# Patient Record
Sex: Male | Born: 1955 | Race: White | Hispanic: No | Marital: Single
Health system: Southern US, Community
[De-identification: ages and names within clinical notes are randomized; demographics above are authoritative.]

---

## 2006-02-05 ENCOUNTER — Ambulatory Visit: Payer: Self-pay | Admitting: Gastroenterology

## 2006-05-27 ENCOUNTER — Ambulatory Visit: Payer: Self-pay | Admitting: Internal Medicine

## 2008-06-15 IMAGING — US ABDOMEN ULTRASOUND
1 series · 17 of 25 positions shown · non-contrast
Comparison: none

REASON FOR EXAM: Abdominal pain, diarrhea
COMMENTS:

[Series 1: abdomen ultrasound · 17 of 70 slices shown]
[im 1/70]
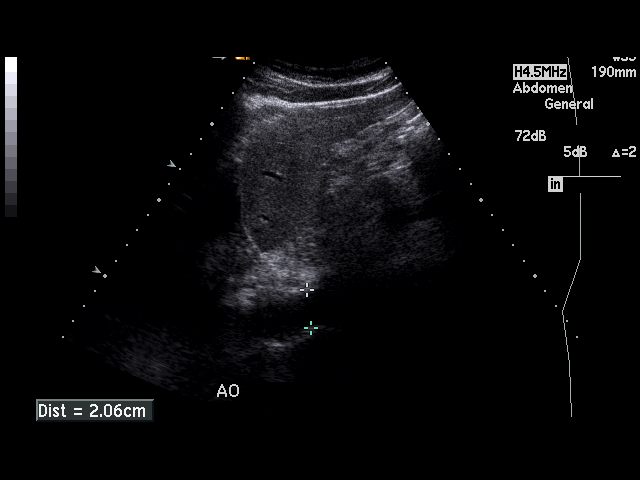
[im 6/70]
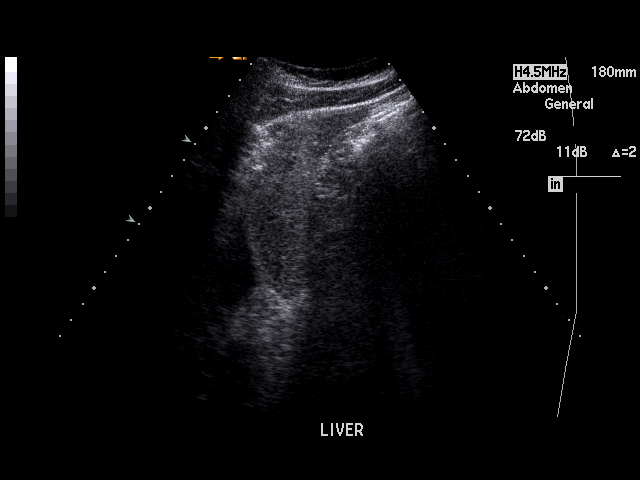
[im 9/70]
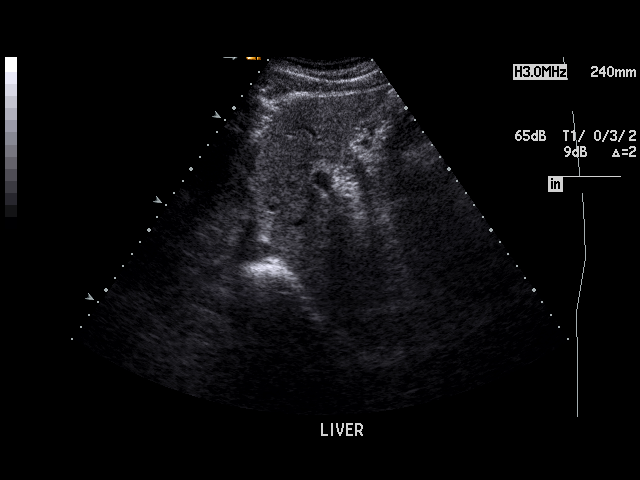
[im 15/70]
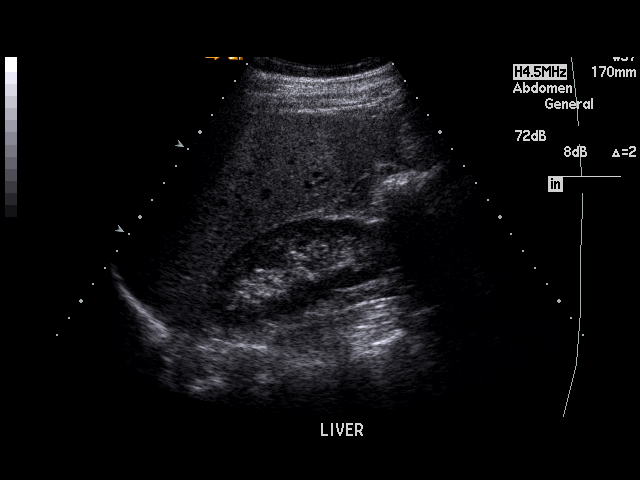
[im 18/70]
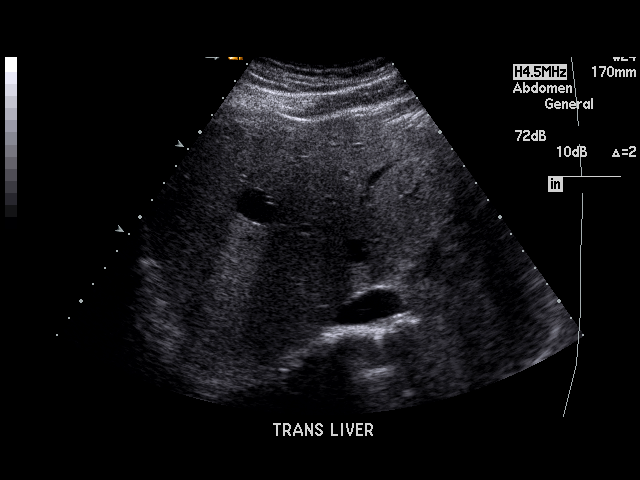
[im 24/70]
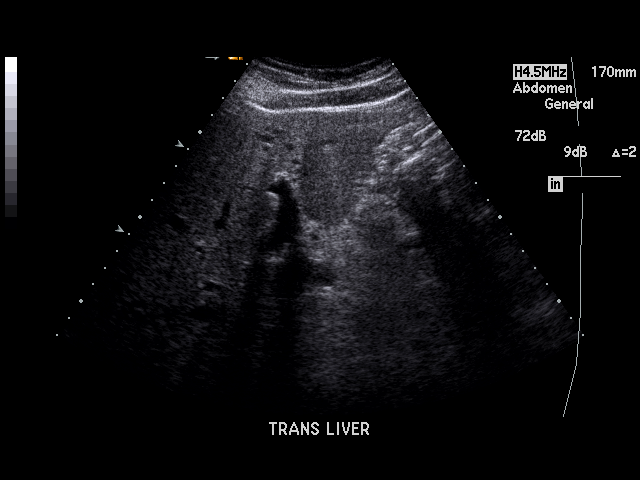
[im 26/70]
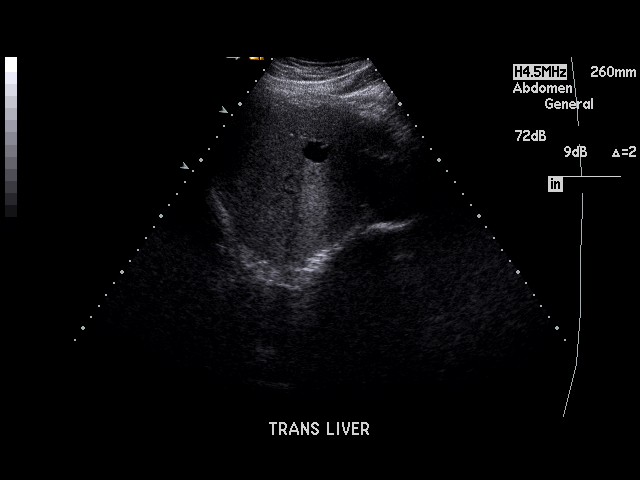
[im 32/70]
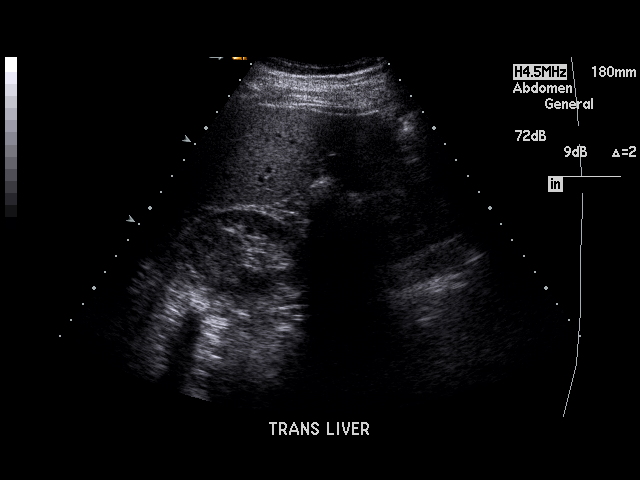
[im 35/70]
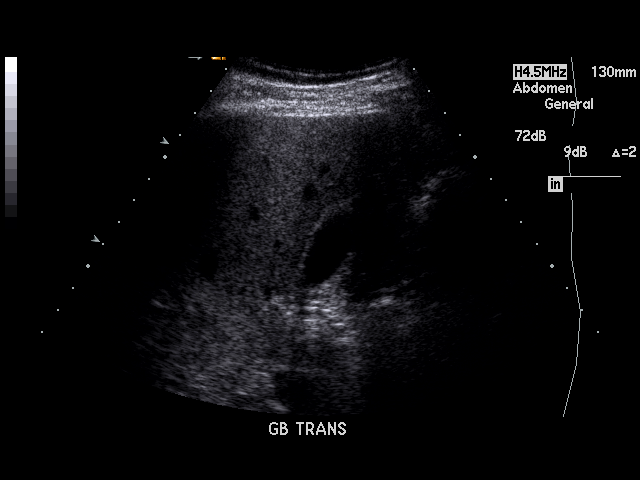
[im 38/70]
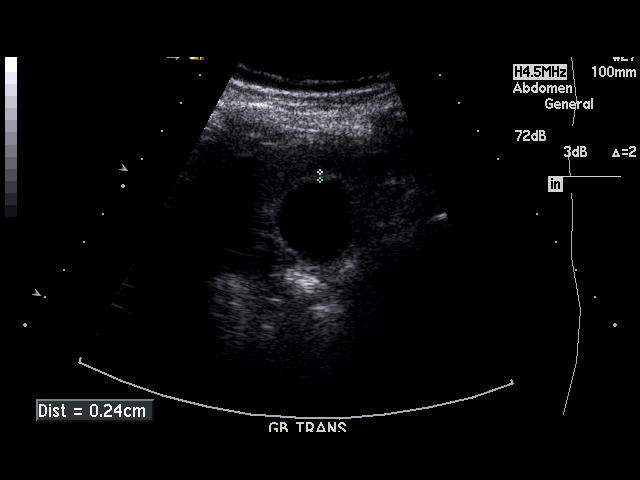
[im 44/70]
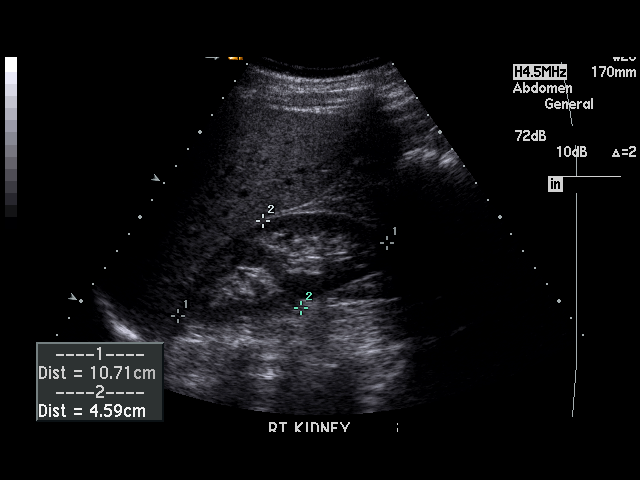
[im 47/70]
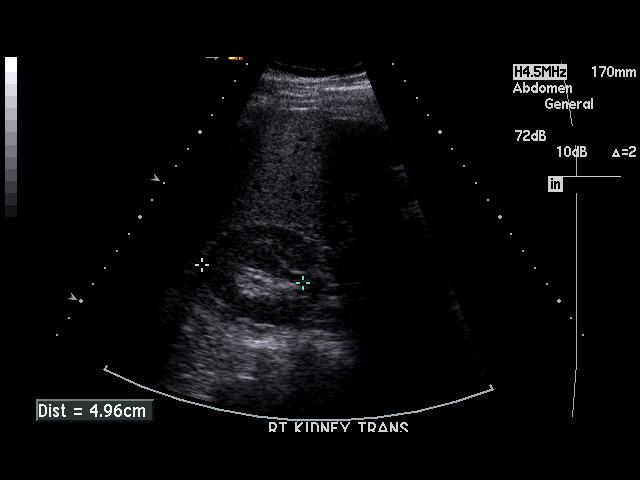
[im 52/70]
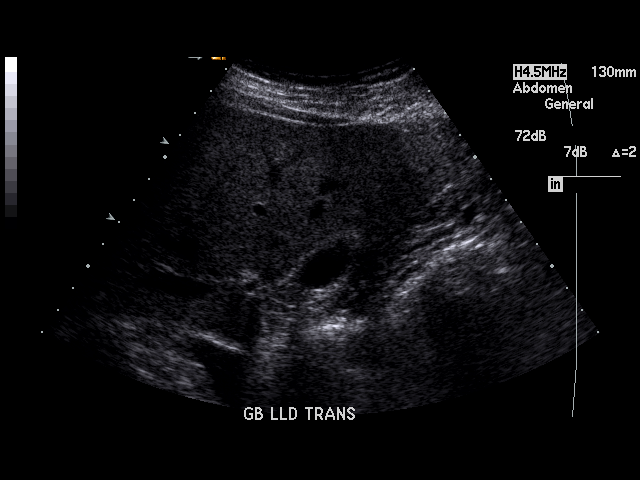
[im 55/70]
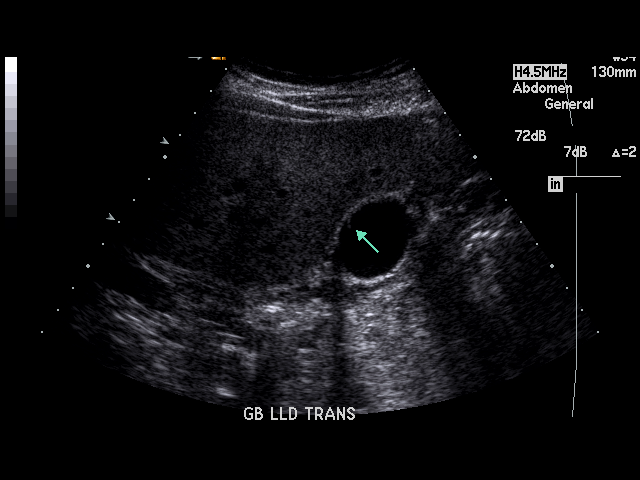
[im 61/70]
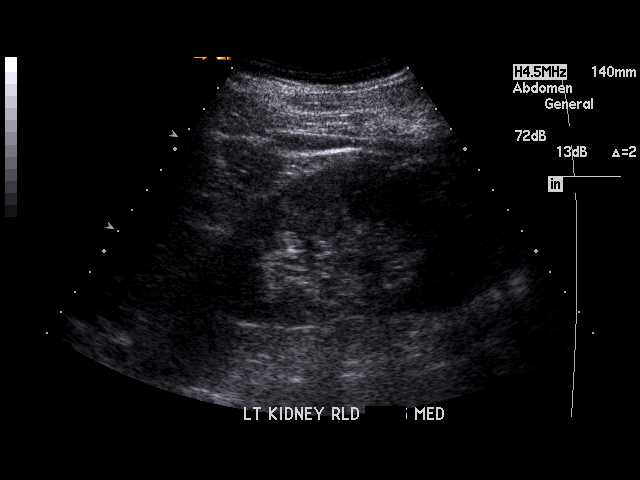
[im 64/70]
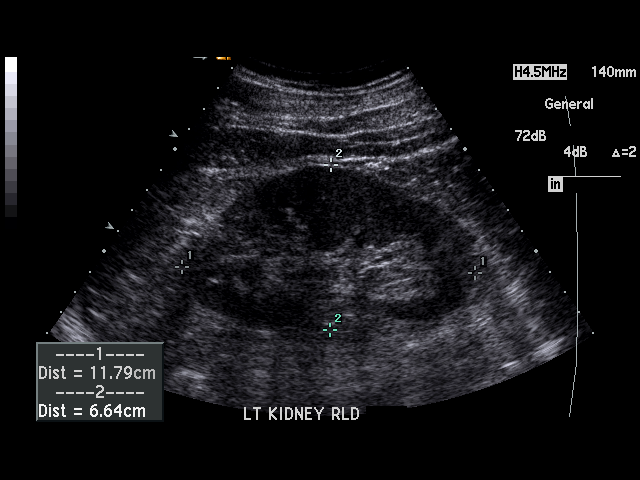
[im 70/70]
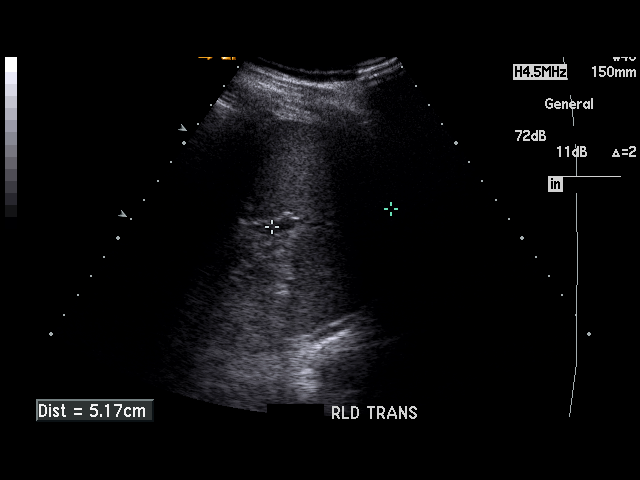

[17 of 25 positions shown; findings below may reference images not displayed]

PROCEDURE:     US  - US ABDOMEN GENERAL SURVEY  - May 27, 2006 [DATE]

RESULT:     The liver exhibits the presence of multiple cysts. The largest
of these measures approximately 1.8 cm and lies in the RIGHT lobe. There is
no intrahepatic ductal dilation. Portal venous flow is normal in direction
toward the liver. The pancreas could only be partially demonstrated due to
the presence of bowel gas. The gallbladder is adequately distended. There is
a tiny focus of slightly increased echogenicity along the anterior
gallbladder wall with some ring down artifact. This could reflect a tiny,
calcified stone. There is no gallbladder wall thickening or pericholecystic
fluid or sonographic Murphy's sign. The common bile duct is normal at 3.8 mm
in diameter. The spleen and kidneys exhibit no acute abnormality. Limited
evaluation of the abdominal aorta revealed no abnormality either. There is
no evidence of ascites.
IMPRESSION: 1.  There are benign appearing cysts in the liver.
2.  There may be a tiny adherent stone versus polyp along the anterior wall
of the gallbladder. There is no finding to suggest cholecystitis.

## 2010-10-13 ENCOUNTER — Ambulatory Visit: Payer: Self-pay | Admitting: Unknown Physician Specialty

## 2010-11-17 ENCOUNTER — Ambulatory Visit: Payer: Self-pay | Admitting: Unknown Physician Specialty

## 2010-11-19 LAB — PATHOLOGY REPORT

## 2011-05-25 ENCOUNTER — Ambulatory Visit: Payer: Self-pay | Admitting: Surgery

## 2012-11-01 IMAGING — NM NUCLEAR MEDICINE GASTRIC EMPTYING STUDY
2 series · 40 of 40 positions shown · non-contrast
Comparison: none

REASON FOR EXAM: gastroesophageal reflux
COMMENTS:

[Series 1000: gastric statics (results) · 3.90mm/px · 10 acquisitions, 20 frames shown]
[im 1/10]
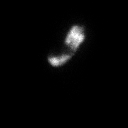
[im 1/10]
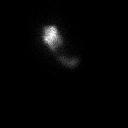
[im 2/10]
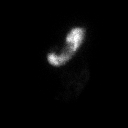
[im 2/10]
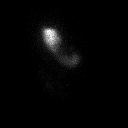
[im 3/10]
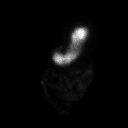
[im 3/10]
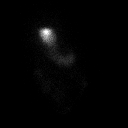
[im 4/10]
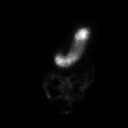
[im 4/10]
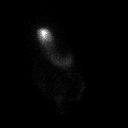
[im 5/10]
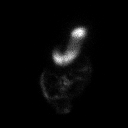
[im 5/10]
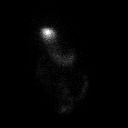
[im 6/10]
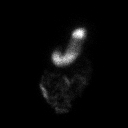
[im 6/10]
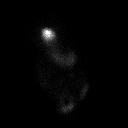
[im 7/10]
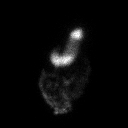
[im 7/10]
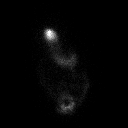
[im 8/10]
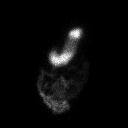
[im 8/10]
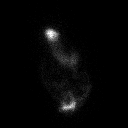
[im 9/10]
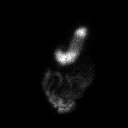
[im 9/10]
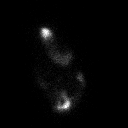
[im 10/10]
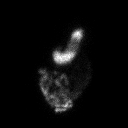
[im 10/10]
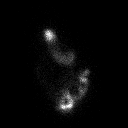

[Series 1000: gastric statics · 3.90mm/px · 10 acquisitions, 20 frames shown]
[im 1/10]
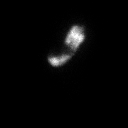
[im 1/10]
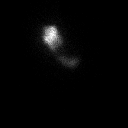
[im 2/10]
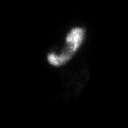
[im 2/10]
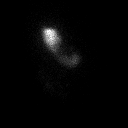
[im 3/10]
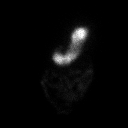
[im 3/10]
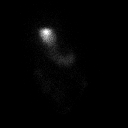
[im 4/10]
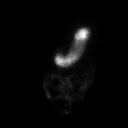
[im 4/10]
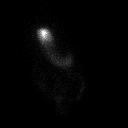
[im 5/10]
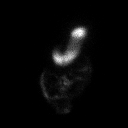
[im 5/10]
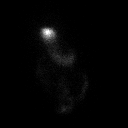
[im 6/10]
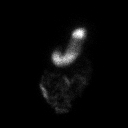
[im 6/10]
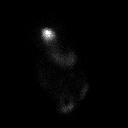
[im 7/10]
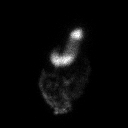
[im 7/10]
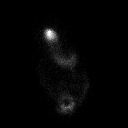
[im 8/10]
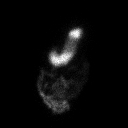
[im 8/10]
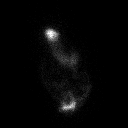
[im 9/10]
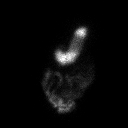
[im 9/10]
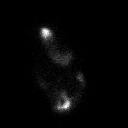
[im 10/10]
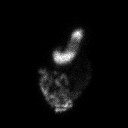
[im 10/10]
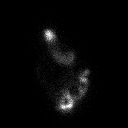

[40 of 40 positions shown; findings below may reference images not displayed]

PROCEDURE:     NM  - NM GASTRIC EMPTYING STUDY  - October 13, 2010 [DATE]

RESULT:     Following oral administration of 2.113 mCi technetium 99 sulfur
colloid administered in an egg sandwich with 8 ounces of water, there is
observed 51% gastric emptying at 95 minutes. This value is within the normal
range. No gastroesophageal reflux is seen.
IMPRESSION: Gastric emptying measures 51% which is within the normal range.

## 2014-05-13 NOTE — Op Note (Signed)
PATIENT NAME:  Parker Briggs, Parker Briggs MR#:  [id number] DATE OF BIRTH:  Jan 30, 1955  DATE OF PROCEDURE:  05/25/2011  PREOPERATIVE DIAGNOSIS: Hiatus hernia with chronic gastroesophageal reflux.   POSTOPERATIVE DIAGNOSIS: Hiatus hernia with chronic gastroesophageal reflux.   PROCEDURE: Laparoscopic hiatus hernia repair with fundoplication.   SURGEON: Renda RollsWilton Semone Orlov, Briggs.D.   ANESTHESIA: General.   INDICATIONS: This 59 year old male has a 10- year history of heartburn. He had recent endoscopic findings of moderate-size hiatus hernia. He had endoscopic evidence of esophagitis, a past history of Barrett's esophagus, and manometry demonstrated a hypotensive distal esophageal sphincter. Gastric emptying study was normal.   The patient was advised to have laparoscopic fundoplication and hiatus hernia repair as definitive treatment.   DESCRIPTION OF PROCEDURE: The patient was placed on the operating table in the supine position under general anesthesia. The abdomen was prepared with ChloraPrep and draped in a sterile manner. The first incision was made in the epigastrium just about an inch half above the umbilicus and oriented longitudinally and carried down through subcutaneous tissues to encounter the deep fascia, which was grasped with a laryngeal hook and elevated. A Veress needle was inserted, aspirated, and irrigated with a saline solution. Next, the peritoneal cavity was inflated with carbon dioxide. The Veress needle was removed. The 10-mm cannula was inserted. The 10-mm, 25-degree laparoscope was inserted to view the peritoneal cavity. 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 These just appeared to be less than a centimeter in dimension. Next, another incision was made in the subxiphoid position slightly to the left of the midline and adjacent to the xiphoid process and I inserted an 11-mm cannula. Another incision was made in the left subcostal position at  the anterior axillary line to insert an 11-mm cannula. Another incision was made in the subcostal position on the right side at the midclavicular line, to insert an 11-mm cannula, another midway between that point and the camera site to insert another 11-mm cannula, and ultimately a sixth incision in the right upper quadrant at the anterior axillary line to insert an 11-mm cannula.   The patient was placed in the reverse Trendelenburg position. The fan retractor was introduced through the subxiphoid port to retract the left lobe of the liver anteriorly and towards the right, exposing the diaphragmatic hiatus. This fan retractor was held in place with a Bookwalter mechanical arm. Next, traction was applied to the stomach. There was a paraesophageal hiatus hernia and we brought the small amount of stomach down out of the diaphragmatic defect. Next  the Harmonic scalpel was used to incise the gastrohepatic ligament, and this dissection was carried along the peritoneum just anterior to the esophagus, dissecting from right to left, and extended to dissect over to identify the left crus of the diaphragm. Next, dissection was carried out to separate the right crus of the diaphragm from the esophagus and stomach, and created a window posterior to the esophagus. This took a fair amount of dissection to create this window and triangulated it to approximately 3 cm in dimension, making an adequate site for the fundoplication. The left and right crura of the diaphragm were further identified.  0 Surgidac sutures were used to approximate the left crus to the right crus. These were interrupted 0 Surgidac figure-of-eight sutures. Next, the Gore-Tex Bio-A 7 x 10-cm mesh was cut to create an oval shape. It had a  previously placed U-shaped incision in it to straddle the esophagus. This  was placed around the esophagus so that the open part of the U was anterior. This was sutured to the diaphragm on the left side with 0 Surgidac and  also was sutured and on the right side to the crus with 0 Surgidac. Next, the a short gastric vessels were dissected for approximately a distance of an inch with the Harmonic scalpel to allow further mobility of the fundus. Next, a floppy portion of  fundus was brought posterior to the esophagus from left to right. Another more anterior portion of the fundus was brought adjacent to this. The fundoplication was carried out with 0 Surgidac sutures, placing three so that the first was 2 cm from the last, and this was satisfactorily loose. Next, a portion of the fundus was sutured to the right crus of the diaphragm. It is noted during the course of the procedure just a small amount of blood was aspirated. Estimated blood loss was approximately 5 mL. Next, the cannulas were removed. There was some scant bleeding from port site and the right upper quadrant at the midclavicular line and the tissues were infiltrated with 1% Xylocaine with epinephrine, and also infiltrated the subcutaneous tissues through the other port sites as well. Next, the wounds were closed with interrupted 4-0 chromic subcuticular sutures and Dermabond. The patient tolerated surgery satisfactorily and was then prepared for transfer to the recovery room.  ____________________________ Shela Commons. Renda Rolls, MD [email or web address] D: 05/25/2011 11:02:49 ET T: 05/25/2011 12:09:42 ET JOB#: [id number]  Adella Hare MD ELECTRONICALLY SIGNED 05/26/2011 21:17

## 2014-05-13 NOTE — Consult Note (Signed)
    General Aspect patient is a 59 year old male with no cardiac history who has postop Nissen fundoplication.  Patient developed  upper epigastric chest discomfort upon arrival in the PACU.  He denied prior symptoms as an outpatient.  He denies any exertional pain.  Pain does not radiate.  It is worsened with deep palpation in the incisional site.  He denies orthopnea or PND.  EKG with pain was normal.  Patient has no exertional or rest chest pain as an outpatient prior to this event.  He is currently hemodynamically stable.   Physical Exam:   GEN well developed, well nourished, no acute distress    HEENT PERRL    NECK supple    RESP clear BS    CARD Regular rate and rhythm  Normal, S1, S2  No murmur    ABD positive tenderness  hypoactive BS  no Adominal Mass    LYMPH negative neck    EXTR negative cyanosis/clubbing, negative edema    SKIN normal to palpation    NEURO cranial nerves intact    PSYCH lethargic, postanesthesia   Review of Systems:   Subjective/Chief Complaint epigastric, incisional abdominal pain    General: No Complaints    Skin: No Complaints    ENT: No Complaints    Eyes: No Complaints    Neck: No Complaints    Respiratory: No Complaints    Cardiovascular: No Complaints    Gastrointestinal: Heartburn  epigastric discomfort    Genitourinary: No Complaints    Vascular: No Complaints    Musculoskeletal: No Complaints    Neurologic: No Complaints    Hematologic: No Complaints    Endocrine: No Complaints    Psychiatric: No Complaints    Review of Systems: All other systems were reviewed and found to be negative    Medications/Allergies Reviewed Medications/Allergies reviewed     Hiatal Hernia:    Gastric Reflux:    Migraines:   EKG:   EKG NSR    Flagyl: N/V/Diarrhea    Impression 59 year old male with history of heartburn who is status post Nissen fundoplication.  Patient developed incisional and upper epigastric discomfort  postop in the PACU.  He is currently hemodynamically stable.  EKG is normal.  He had no prior cardiac history.  Patient is not appear to have ischemic pain.  This appears to be post procedural.  He is hemodynamically stable.    Plan 1.  Continue with current post operative care 2.  Patient's symptoms did not appear ischemic.  EKG with pain is normal. 3.  proceed with routine postop care.  Will not require any further cardiac workup during this hospitalization.  4. Will be happy to follow as outpatient if desired.   Electronic Signatures: Dalia HeadingFath, Cathren Sween A (MD)  (Signed 15-May-13 09:39)  Authored: General Aspect/Present Illness, History and Physical Exam, Review of System, Past Medical History, EKG , Allergies, Impression/Plan   Last Updated: 15-May-13 09:39 by Dalia HeadingFath, Eswin Worrell A (MD)
